# Patient Record
Sex: Male | Born: 1995 | Race: Black or African American | Hispanic: No | Marital: Single | Smoking: Current every day smoker
Health system: Southern US, Community
[De-identification: ages and names within clinical notes are randomized; demographics above are authoritative.]

## PROBLEM LIST (undated history)

## (undated) DIAGNOSIS — J45909 Unspecified asthma, uncomplicated: Secondary | ICD-10-CM

---

## 2018-10-20 ENCOUNTER — Encounter (HOSPITAL_COMMUNITY): Payer: Self-pay

## 2018-10-20 ENCOUNTER — Emergency Department (HOSPITAL_COMMUNITY): Payer: Self-pay

## 2018-10-20 ENCOUNTER — Other Ambulatory Visit: Payer: Self-pay

## 2018-10-20 ENCOUNTER — Emergency Department (HOSPITAL_COMMUNITY)
Admission: EM | Admit: 2018-10-20 | Discharge: 2018-10-20 | Disposition: A | Discharge status: Home / Self Care | Payer: Self-pay | Attending: Emergency Medicine | Admitting: Emergency Medicine

## 2018-10-20 DIAGNOSIS — S93401A Sprain of unspecified ligament of right ankle, initial encounter: Secondary | ICD-10-CM | POA: Insufficient documentation

## 2018-10-20 DIAGNOSIS — Y999 Unspecified external cause status: Secondary | ICD-10-CM | POA: Insufficient documentation

## 2018-10-20 DIAGNOSIS — F17210 Nicotine dependence, cigarettes, uncomplicated: Secondary | ICD-10-CM | POA: Insufficient documentation

## 2018-10-20 DIAGNOSIS — F1290 Cannabis use, unspecified, uncomplicated: Secondary | ICD-10-CM | POA: Insufficient documentation

## 2018-10-20 DIAGNOSIS — Y929 Unspecified place or not applicable: Secondary | ICD-10-CM | POA: Insufficient documentation

## 2018-10-20 DIAGNOSIS — X500XXA Overexertion from strenuous movement or load, initial encounter: Secondary | ICD-10-CM | POA: Insufficient documentation

## 2018-10-20 DIAGNOSIS — Y939 Activity, unspecified: Secondary | ICD-10-CM | POA: Insufficient documentation

## 2018-10-20 HISTORY — DX: Unspecified asthma, uncomplicated: J45.909

## 2018-10-20 MED ORDER — IBUPROFEN 400 MG PO TABS
800.0000 mg | ORAL_TABLET | Freq: Once | ORAL | Status: AC
  Administered 2018-10-20: 800 mg via ORAL
  Filled 2018-10-20: qty 2

## 2018-10-20 NOTE — ED Notes (Signed)
Ortho to come and apply CAM walker.

## 2018-10-20 NOTE — Discharge Instructions (Addendum)
Apply ice and elevate for 20 minutes at a time to help with pain and swelling. Take Motrin and Tylenol as needed as directed for pain. Follow up with orthopedics in 1 week if not improving.

## 2018-10-20 NOTE — ED Provider Notes (Signed)
[facility] DEPART[facility] Provider Note   CSN: [id number] Arrival date & time: 10/20/18  1617    History   Chief Complaint Chief Complaint  Patient presents with  . Ankle Pain    HPI Darryl Kennedy is a 23 y.o. male.     23 year old male presents with complaint of right ankle pain.  Patient states that he was pivoting towards his right on his right foot when he rolled his ankle and simultaneously stepped on his right ankle with his left foot.  Patient states he is been unable to bear weight on the right leg since this happened about 1 hour prior to arrival today.  Patient is not taking anything for pain.  No prior injuries to this leg.  No other complaints or concerns.     Past Medical History:  Diagnosis Date  . Asthma     There are no active problems to display for this patient.   History reviewed. No pertinent surgical history.      Home Medications    Prior to Admission medications   Not on File    Family History History reviewed. No pertinent family history.  Social History Social History   Tobacco Use  . Smoking status: Current Every Day Smoker  . Smokeless tobacco: Current User  Substance Use Topics  . Alcohol use: Yes    Comment: QOD  . Drug use: Yes    Types: Marijuana     Allergies   Patient has no known allergies.   Review of Systems Review of Systems  Constitutional: Negative for fever.  Musculoskeletal: Positive for arthralgias, gait problem, joint swelling and myalgias.  Skin: Negative for color change, pallor, rash and wound.  Allergic/Immunologic: Negative for immunocompromised state.  Neurological: Negative for weakness and numbness.  Hematological: Does not bruise/bleed easily.  Psychiatric/Behavioral: Negative for confusion.  All other systems reviewed and are negative.    Physical Exam Updated Vital Signs BP 126/73 (BP Location: Right Arm)   Pulse (!) 103   Temp 98.3 F (36.8 C) (Oral)    Resp 18   Ht 6' (1.829 m)   Wt 113.4 kg   SpO2 99%   BMI 33.91 kg/m   Physical Exam Vitals signs and nursing note reviewed.  Constitutional:      General: He is not in acute distress.    Appearance: He is well-developed. He is not diaphoretic.  HENT:     Head: Normocephalic and atraumatic.  Cardiovascular:     Pulses: Normal pulses.  Pulmonary:     Effort: Pulmonary effort is normal.  Musculoskeletal:        General: Swelling, tenderness and signs of injury present. No deformity.     Right knee: Normal.     Right ankle: He exhibits decreased range of motion and swelling. He exhibits no ecchymosis, no deformity, no laceration and normal pulse. Tenderness. Lateral malleolus and medial malleolus tenderness found. No head of 5th metatarsal and no proximal fibula tenderness found. Achilles tendon exhibits no pain and no defect.     Right lower leg: No edema.     Left lower leg: No edema.     Right foot: Decreased range of motion. Normal capillary refill. Tenderness and swelling present. No crepitus, deformity or laceration.  Skin:    General: Skin is warm and dry.     Capillary Refill: Capillary refill takes less than 2 seconds.     Findings: No erythema or rash.  Neurological:  General: No focal deficit present.     Mental Status: He is alert and oriented to person, place, and time.  Psychiatric:        Behavior: Behavior normal.      ED Treatments / Results  Labs (all labs ordered are listed, but only abnormal results are displayed) Labs Reviewed - No data to display  EKG None  Radiology Dg Ankle Complete Right  Result Date: 10/20/2018 CLINICAL DATA:  Lateral right ankle pain. EXAM: RIGHT ANKLE - COMPLETE 3+ VIEW COMPARISON:  None. FINDINGS: There is no evidence of fracture, dislocation, or joint effusion. There is no evidence of arthropathy or other focal bone abnormality. Minimal soft tissue swelling about the lateral malleolus. IMPRESSION: 1. No acute fracture or  dislocation identified about the right ankle. 2. Minimal soft tissue swelling about the lateral malleolus. Electronically Signed   By: Ted Mcalpineobrinka  Dimitrova M.D.   On: 10/20/2018 17:12   Dg Foot Complete Right  Result Date: 10/20/2018 CLINICAL DATA:  Lateral right foot pain. EXAM: RIGHT FOOT COMPLETE - 3+ VIEW COMPARISON:  None. FINDINGS: There is no evidence of fracture or dislocation. There is no evidence of arthropathy or other focal bone abnormality. Soft tissues are unremarkable. IMPRESSION: Negative. Electronically Signed   By: Ted Mcalpineobrinka  Dimitrova M.D.   On: 10/20/2018 17:13    Procedures Procedures (including critical care time)  Medications Ordered in ED Medications  ibuprofen (ADVIL) tablet 800 mg (800 mg Oral Given 10/20/18 1735)     Initial Impression / Assessment and Plan / ED Course  I have reviewed the triage vital signs and the nursing notes.  Pertinent labs & imaging results that were available during my care of the patient were reviewed by me and considered in my medical decision making (see chart for details).  Clinical Course as of Oct 20 1735  Thu Oct 20, 2018  [id number] 23 year old male with right ankle pain after rolling his ankle earlier today.  Unable to bear weight after the incident.  Patient has medial and lateral ankle tenderness, mild swelling, diffuse right foot tenderness although no point tenderness over the fifth metatarsal.  Sensation is intact to each digit, normal capillary refill, dorsalis pedis pulse present.  No pain at the knee.  No other injuries.  X-ray shows swelling, no acute bony injury.  Patient requests a cam walking boot.  Recommend that he apply ice and elevate, take Motrin Tylenol for pain as directed and recheck with orthopedics in 1 week if not improving.   [LM]    Clinical Course User Index [LM] Jeannie FendMurphy, Makiya Jeune A, PA-C      Final Clinical Impressions(s) / ED Diagnoses   Final diagnoses:  Sprain of right ankle, unspecified ligament, initial  encounter    ED Discharge Orders    None       Jeannie FendMurphy, Ricci Dirocco A, PA-C 10/20/18 1737    Niel HummerKuhner, Ross, MD 10/21/18 [phone]

## 2018-10-20 NOTE — ED Triage Notes (Signed)
Pt arrives POV for eval of R ankle pain s/p rolling it 1 hour PTA. +swelling

## 2020-04-29 IMAGING — DX RIGHT ANKLE - COMPLETE 3+ VIEW
3 series · 3 of 3 positions shown · non-contrast
Comparison: None.

CLINICAL DATA: Lateral right ankle pain.

EXAM:
RIGHT ANKLE - COMPLETE 3+ VIEW

[ankle ap]
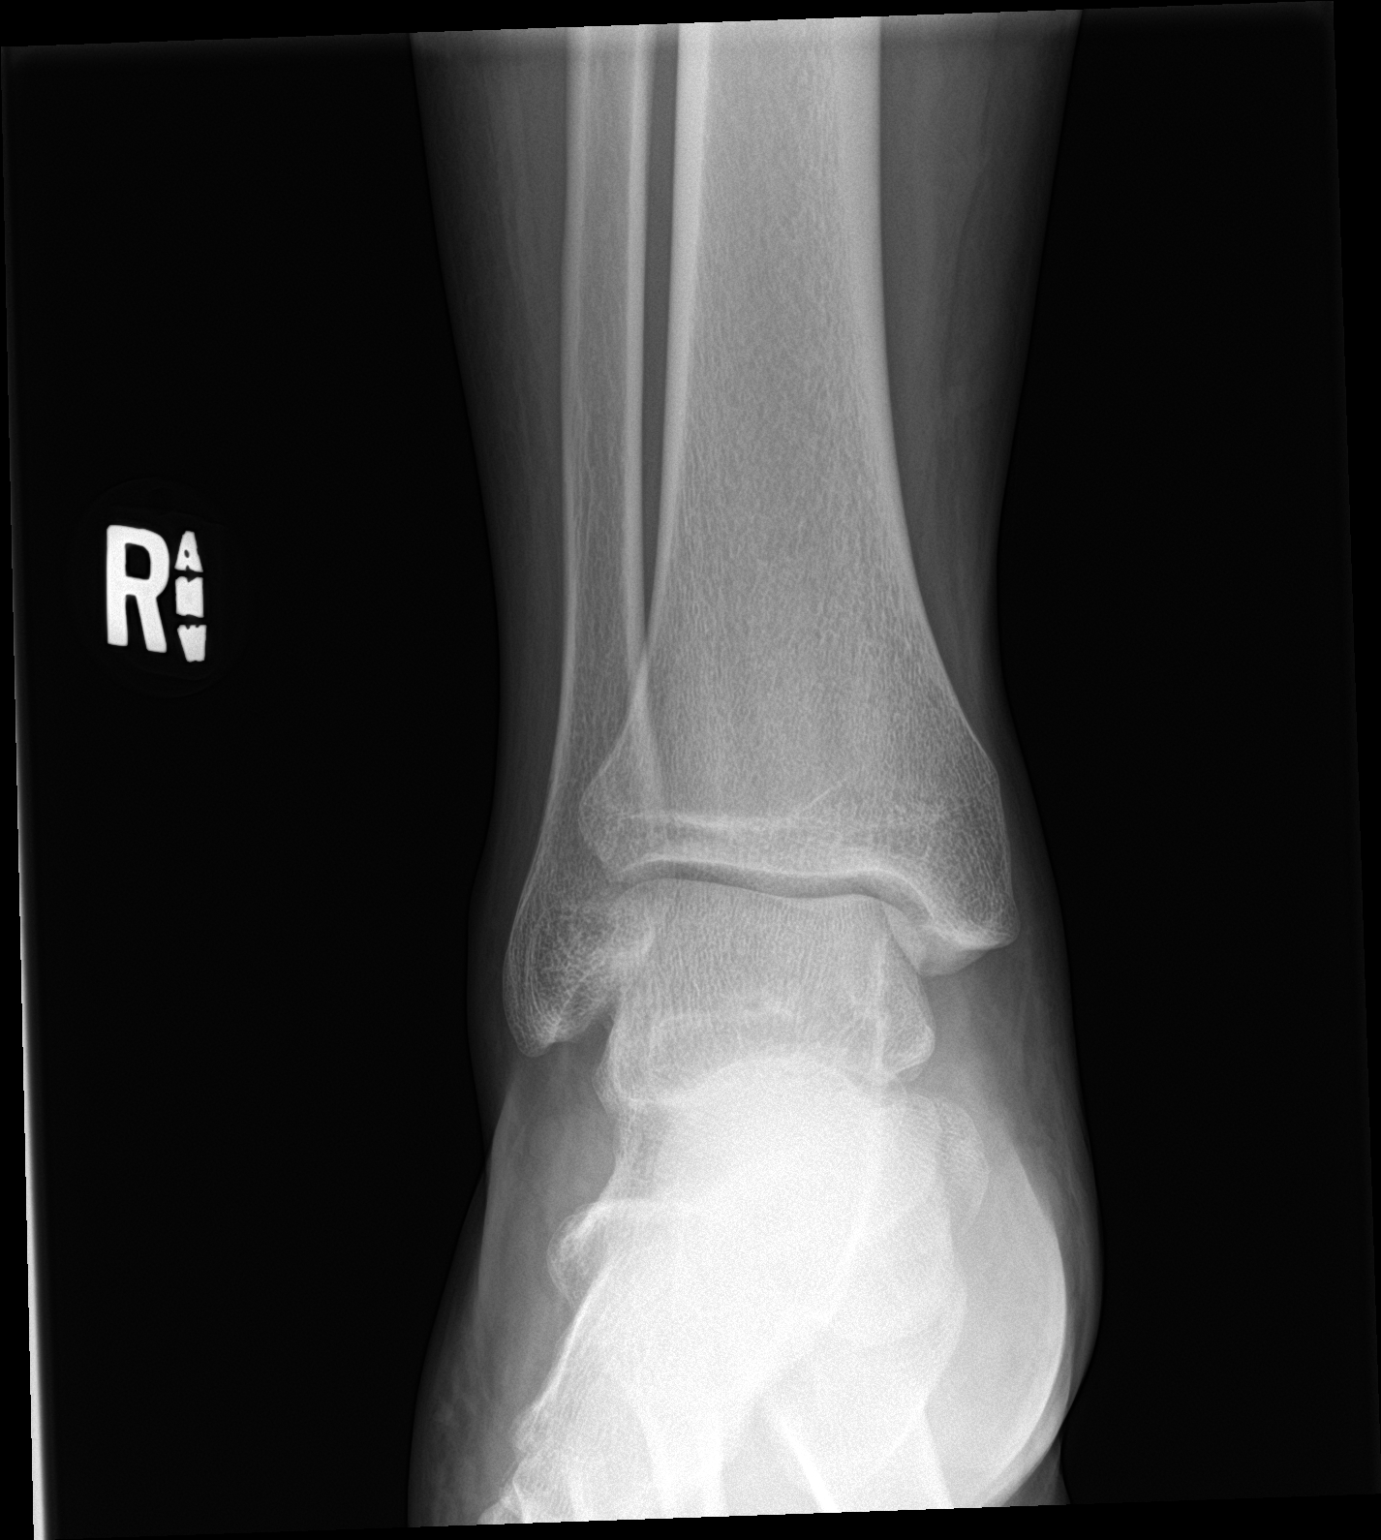

[ankle obl]
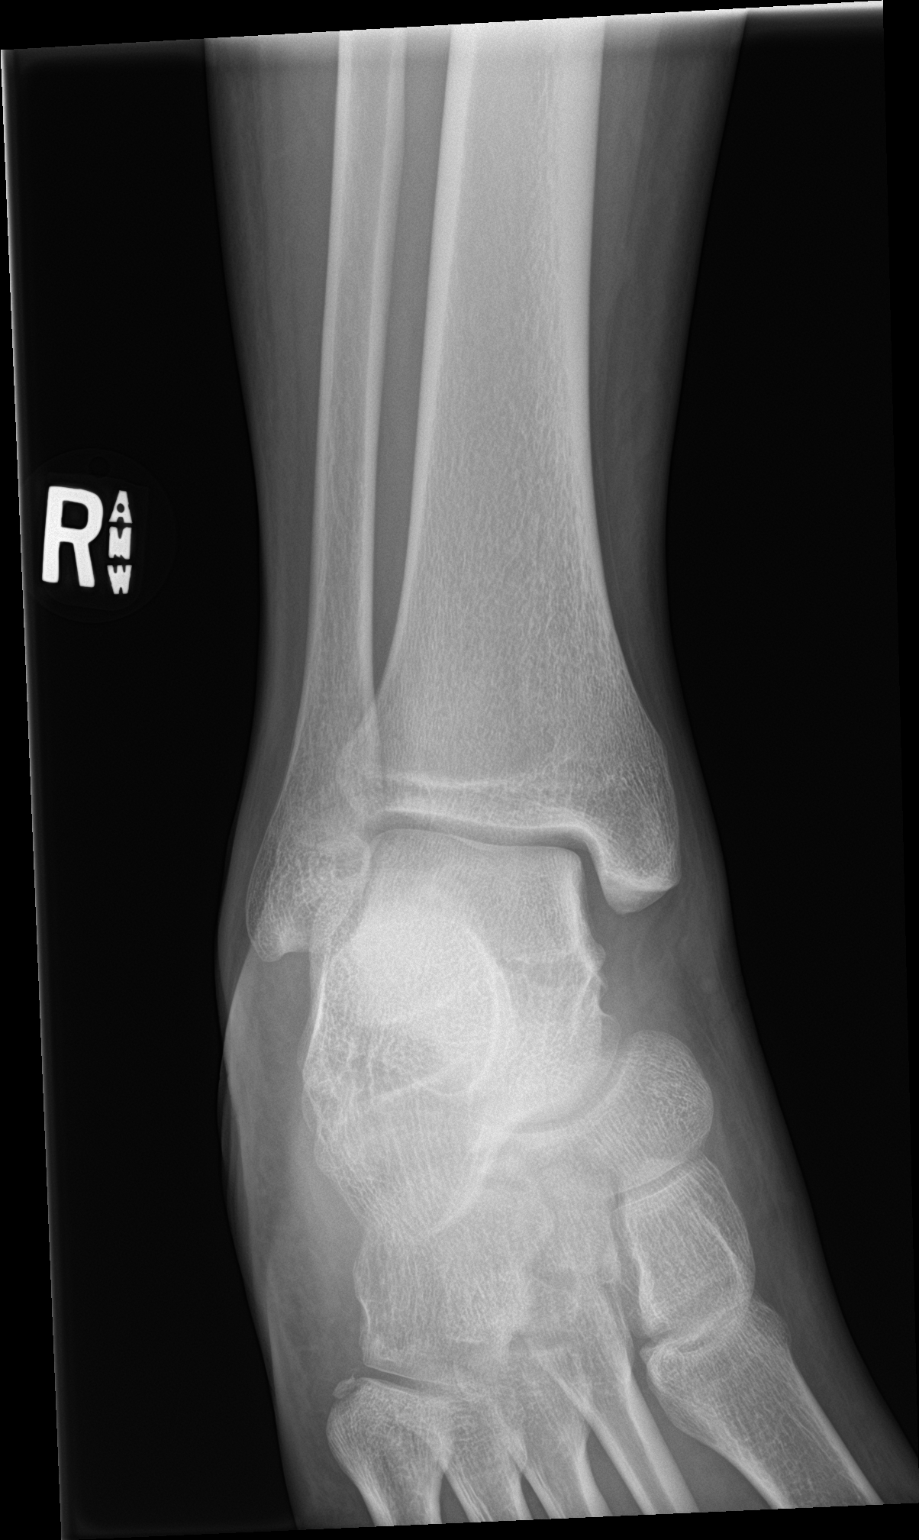

[ankle lat]
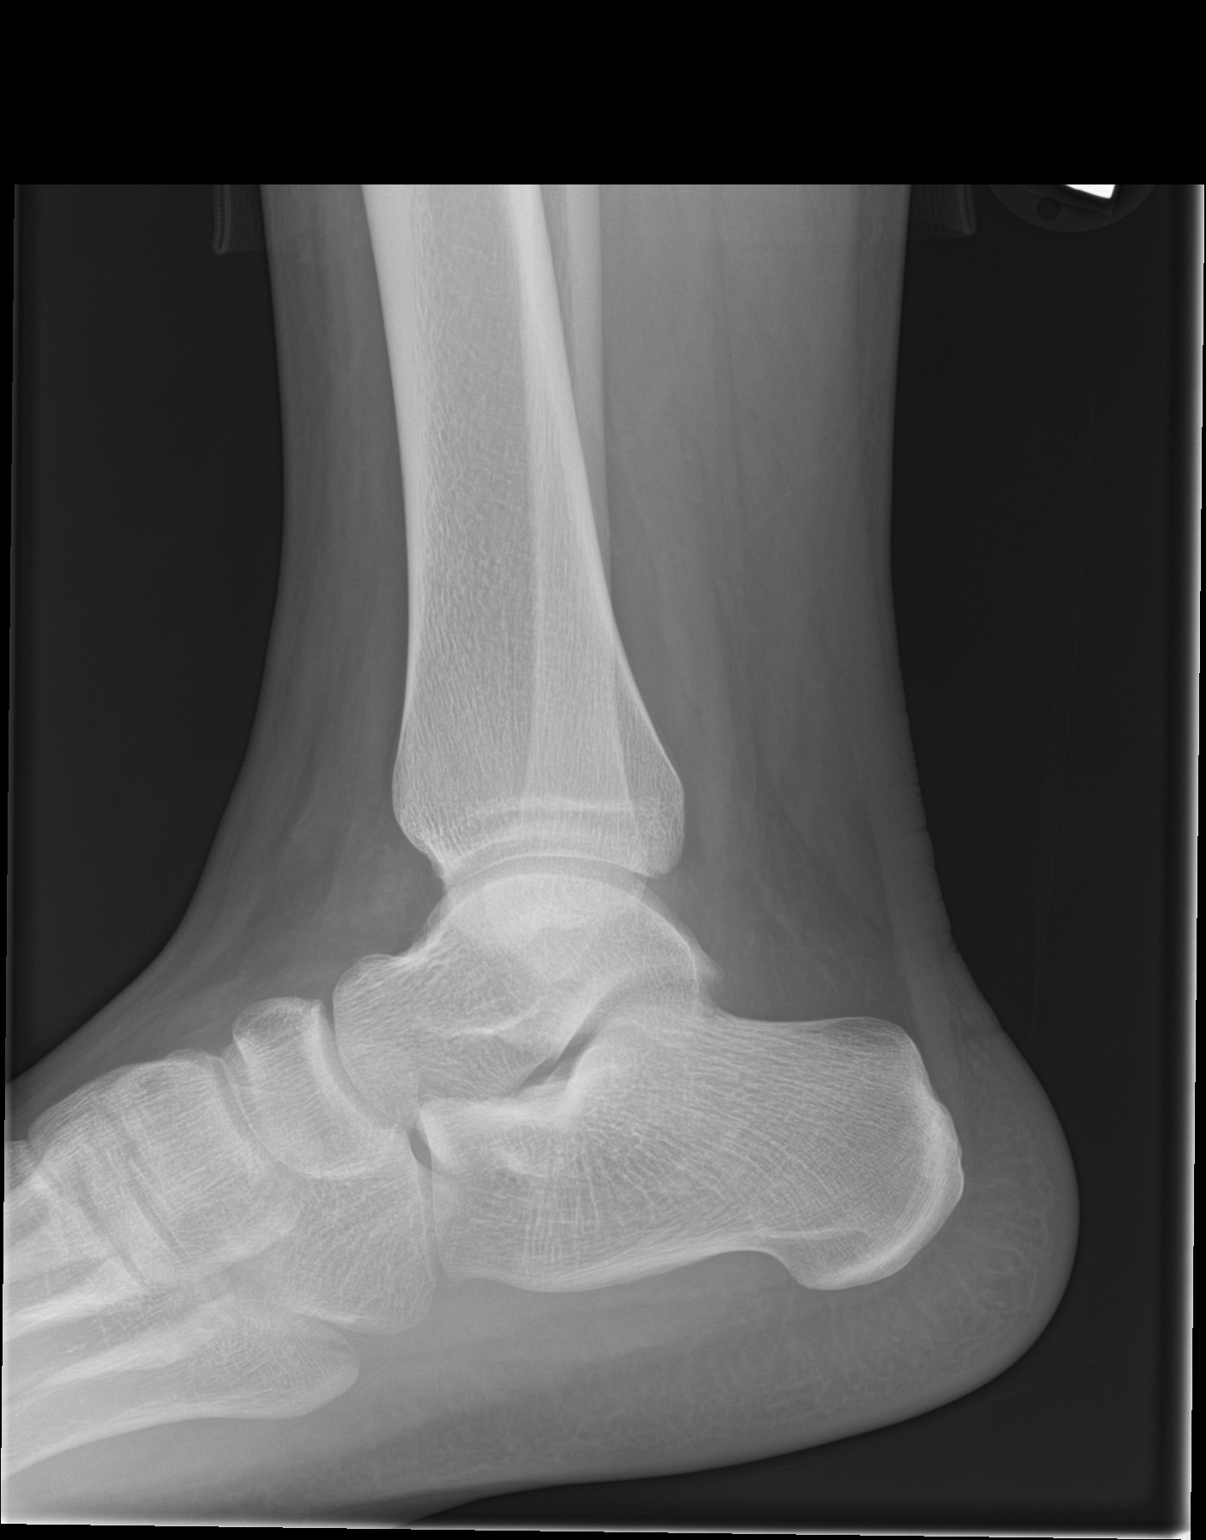

[3 of 3 positions shown; findings below may reference images not displayed]

FINDINGS: There is no evidence of fracture, dislocation, or joint effusion.
There is no evidence of arthropathy or other focal bone abnormality.
Minimal soft tissue swelling about the lateral malleolus.
IMPRESSION: 1. No acute fracture or dislocation identified about the right
ankle.
2. Minimal soft tissue swelling about the lateral malleolus.

## 2020-04-29 IMAGING — DX RIGHT FOOT COMPLETE - 3+ VIEW
3 series · 3 of 3 positions shown · non-contrast
Comparison: None.

CLINICAL DATA: Lateral right foot pain.

EXAM:
RIGHT FOOT COMPLETE - 3+ VIEW

[foot ap]
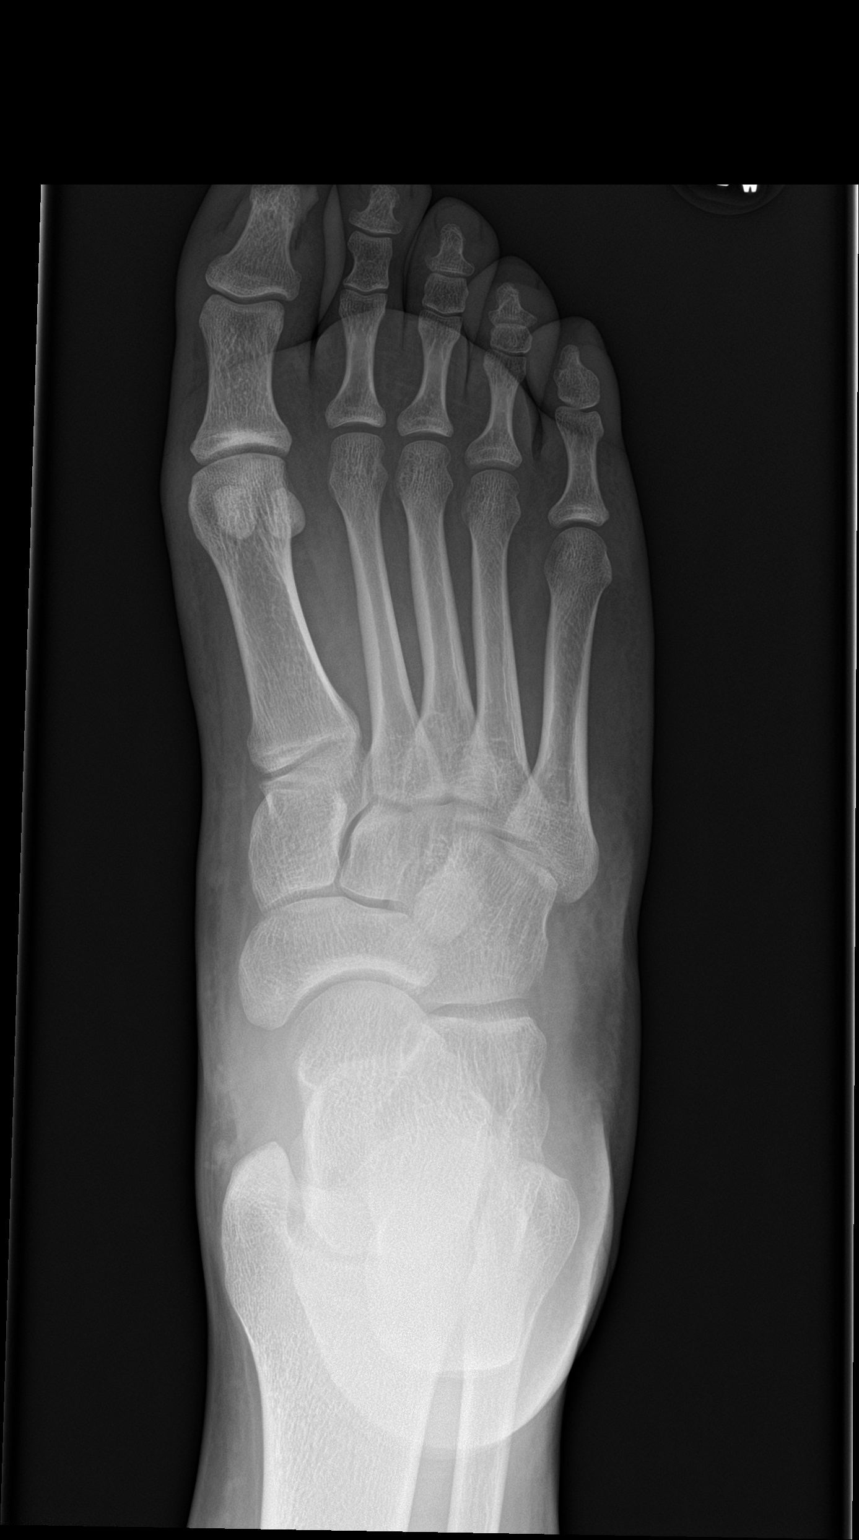

[foot obl]
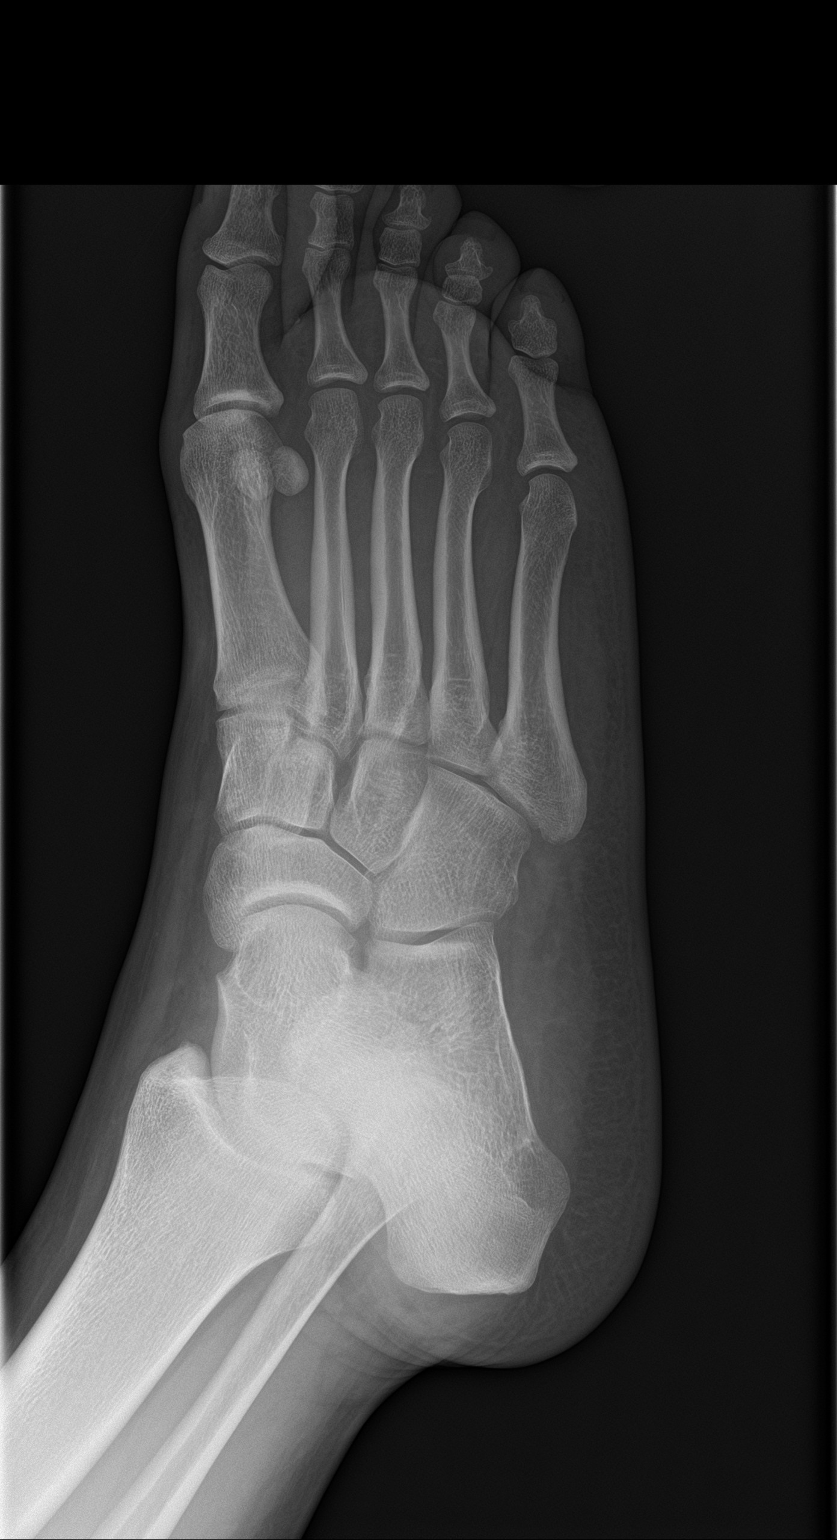

[foot lat]
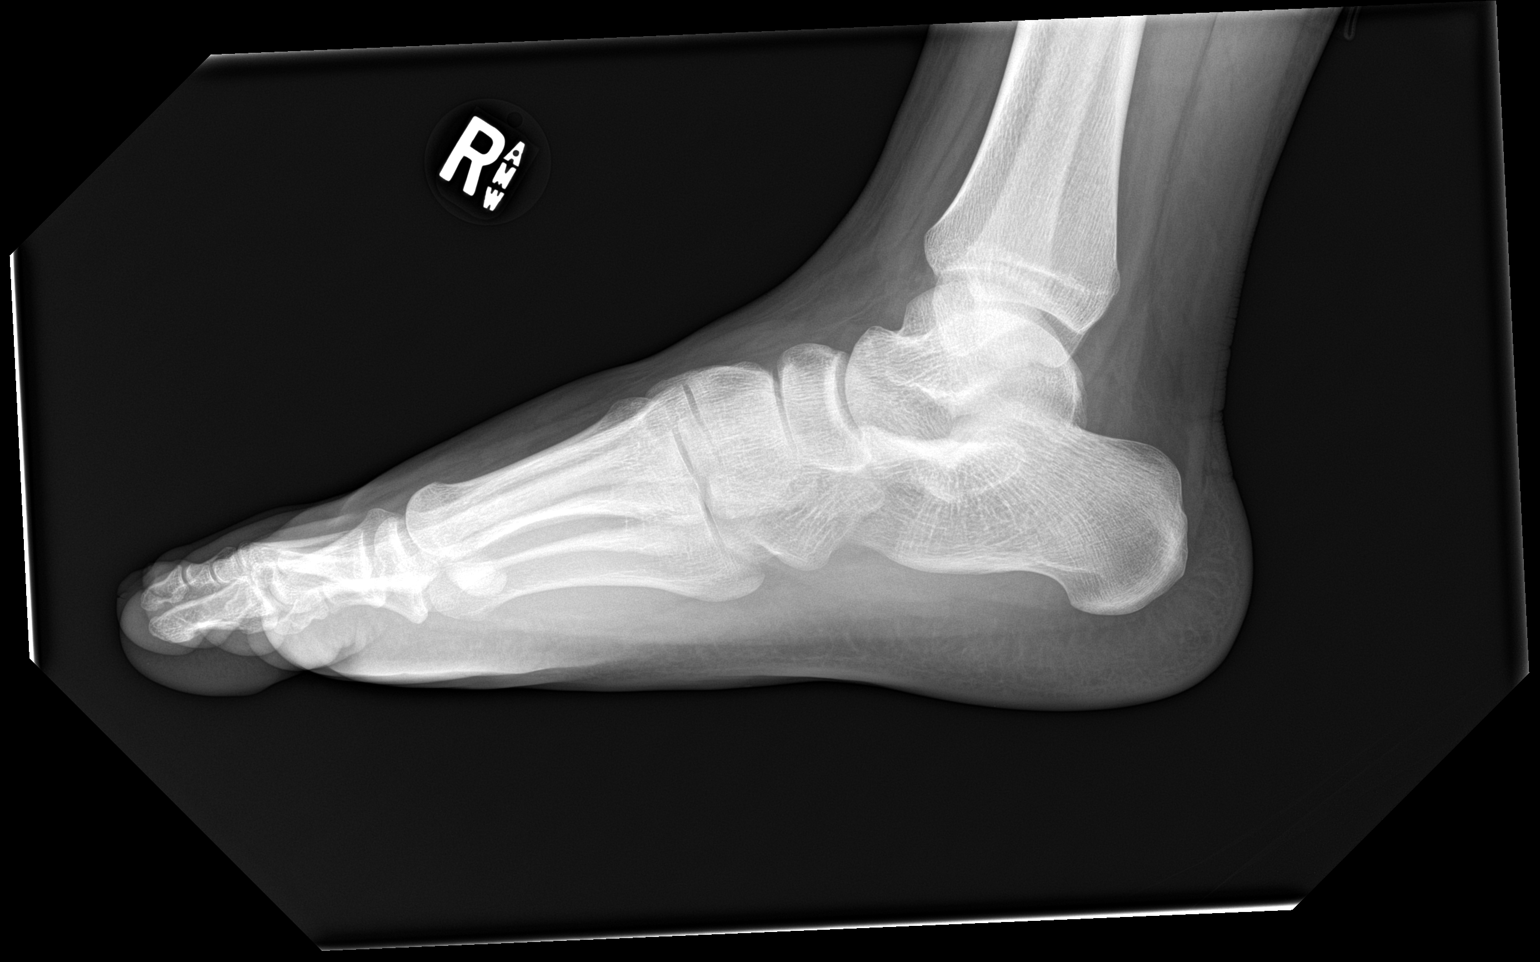

[3 of 3 positions shown; findings below may reference images not displayed]

FINDINGS: There is no evidence of fracture or dislocation. There is no
evidence of arthropathy or other focal bone abnormality. Soft
tissues are unremarkable.
IMPRESSION: Negative.
